# Patient Record
Sex: Male | Born: 1995
Health system: Southern US, Community
[De-identification: ages and names within clinical notes are randomized; demographics above are authoritative.]

## PROBLEM LIST (undated history)

## (undated) DIAGNOSIS — Z9189 Other specified personal risk factors, not elsewhere classified: Secondary | ICD-10-CM

## (undated) DIAGNOSIS — F909 Attention-deficit hyperactivity disorder, unspecified type: Secondary | ICD-10-CM

## (undated) DIAGNOSIS — F1010 Alcohol abuse, uncomplicated: Secondary | ICD-10-CM

## (undated) DIAGNOSIS — K219 Gastro-esophageal reflux disease without esophagitis: Secondary | ICD-10-CM

## (undated) DIAGNOSIS — R12 Heartburn: Secondary | ICD-10-CM

## (undated) DIAGNOSIS — G43909 Migraine, unspecified, not intractable, without status migrainosus: Secondary | ICD-10-CM

## (undated) DIAGNOSIS — K921 Melena: Secondary | ICD-10-CM

## (undated) DIAGNOSIS — R55 Syncope and collapse: Secondary | ICD-10-CM

## (undated) DIAGNOSIS — F1911 Other psychoactive substance abuse, in remission: Secondary | ICD-10-CM

## (undated) HISTORY — PX: TONSILLECTOMY: SUR1361

## (undated) HISTORY — DX: Melena: K92.1

## (undated) HISTORY — DX: Migraine, unspecified, not intractable, without status migrainosus: G43.909

## (undated) HISTORY — PX: FRACTURE SURGERY: SHX138

## (undated) HISTORY — DX: Alcohol abuse, uncomplicated: F10.10

## (undated) HISTORY — DX: Other specified personal risk factors, not elsewhere classified: Z91.89

## (undated) HISTORY — DX: Gastro-esophageal reflux disease without esophagitis: K21.9

## (undated) HISTORY — DX: Other psychoactive substance abuse, in remission: F19.11

## (undated) HISTORY — PX: ADENOIDECTOMY: SUR15

## (undated) HISTORY — PX: MENISCUS REPAIR: SHX5179

## (undated) HISTORY — DX: Heartburn: R12

---
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
## 2018-12-08 DIAGNOSIS — J101 Influenza due to other identified influenza virus with other respiratory manifestations: Secondary | ICD-10-CM | POA: Diagnosis not present

## 2019-04-25 ENCOUNTER — Encounter

## 2019-04-25 ENCOUNTER — Other Ambulatory Visit: Payer: Self-pay

## 2019-04-25 ENCOUNTER — Ambulatory Visit (INDEPENDENT_AMBULATORY_CARE_PROVIDER_SITE_OTHER): Payer: BLUE CROSS/BLUE SHIELD | Admitting: Family Medicine

## 2019-04-25 ENCOUNTER — Encounter: Payer: Self-pay | Admitting: Family Medicine

## 2019-04-25 VITALS — BP 125/81 | HR 89 | Temp 98.0°F | Resp 18 | Ht 68.75 in | Wt 253.0 lb

## 2019-04-25 DIAGNOSIS — G43009 Migraine without aura, not intractable, without status migrainosus: Secondary | ICD-10-CM

## 2019-04-25 DIAGNOSIS — R197 Diarrhea, unspecified: Secondary | ICD-10-CM | POA: Diagnosis not present

## 2019-04-25 DIAGNOSIS — K921 Melena: Secondary | ICD-10-CM

## 2019-04-25 DIAGNOSIS — K219 Gastro-esophageal reflux disease without esophagitis: Secondary | ICD-10-CM

## 2019-04-25 DIAGNOSIS — R112 Nausea with vomiting, unspecified: Secondary | ICD-10-CM

## 2019-04-25 MED ORDER — ESOMEPRAZOLE MAGNESIUM 40 MG PO CPDR: 40.0000 mg | DELAYED_RELEASE_CAPSULE | Freq: Every day | ORAL | 2 refills | Status: AC

## 2019-04-25 MED ORDER — SUCRALFATE 1 G PO TABS: 1.0000 g | ORAL_TABLET | Freq: Three times a day (TID) | ORAL | 0 refills | Status: AC

## 2019-04-25 MED ORDER — FAMOTIDINE 20 MG PO TABS: 20.0000 mg | ORAL_TABLET | Freq: Two times a day (BID) | ORAL | 2 refills | Status: AC

## 2019-04-25 NOTE — Progress Notes (Signed)
Patient ID: Greg Wilson, male  DOB: [birth year], 23 y.o.   MRN: [id number] Patient Care Team    Relationship Specialty Notifications Start End  Greg Wilson PCP - General Family Medicine  04/25/19     Chief Complaint  Patient presents with   Diarrhea    [facility], Dr Greg Wilson. Constantly for 2 months. Blood in stool happens every once in a while. Dark red in color. Denies fever.    Blood In Stools   Gastroesophageal Reflux    Pt gets reflux nightly and it makes him vomit. Has had this since he was 23yr old. Pt has taken nexium for this.     Subjective:  Greg Wilson a 23y.o.  male present for new patient establishment. All past medical history, surgical history, allergies, family history, immunizations, medications and social history were updated in the electronic medical record today. All recent labs, ED visits and hospitalizations within the last year were reviewed.   GI complaints:  Patient presents today for new patient establishment with multiple gastrointestinal complaints.  He reports having intermittent diarrhea at least 2-3 times a week for the last 2 months.  He reports he does have normal stool between bouts of diarrhea.  There has been occasions within those 2 months in which she saw dark red blood in his stool.  He reports he is feeling very fatigued.  He is tired all the time.  He states he just does not feel himself and he "feels sick."He thought the changes in his stool pattern was secondary to his diet, so he has changed his diet.  He stopped eating sweets, drinking soda or eating processed meat.  He does have a history of reflux and takes over-the-counter Nexium.  He states he has been getting reflux nightly that can cause him to vomit on occasions.  He has had history of reflux for 3 years.  His family history is many members with GERD.  He reports feeling pain and nausea after every meal, but at night it seems to be worse.  He has never seen a  gastroenterologist for his condition.  There are times he has felt dizzy.  He was in the emergency room in May for dehydration.  He states he felt nauseated and then dizzy, and then passed out while he was at work.  In October 2019 he was seen in the emergency room for gastroenteritis with nausea and vomiting.  In August 2018 he was seen for syncopal episode that was questionable for seizure activity.  He has been seen by neurology when he was a child. Pt denies exposure  to insects, recent travel, under prepared foods, antibiotics  or sick contacts.  He has a daily nicotine user by vaping.  He denies any CBD, cannabinoids or THC substance in his vaping.  He reports having more frequent headaches over the last few months.  He has a history of migraines and used to be on Imitrex.  He currently is using Excedrin Migraine/ASA.  He has a family history of rectal cancer in his maternal grandfather.  He does not believe anybody in the family has irritable bowel disease such as ulcerative colitis or Crohn's.  He admits to weight fluctuations over the last few months unintentionally. MGF rectal cancer. Weight fluctuations.    Depression screen PHQ 2/9 04/26/2019  Decreased Interest 0  Down, Depressed, Hopeless 0  PHQ - 2 Score 0   No flowsheet data found.  No flowsheet data found.  Immunization History  Administered Date(s) Administered   DTaP 04/03/1998, 06/06/1998, 08/07/1998, 11/28/1999, 01/31/2002   HPV Quadrivalent 02/28/2010, 07/12/2010, 10/23/2010   Hepatitis A, Ped/Adol-2 Dose 06/18/2006, 04/13/2007   Hepatitis B, ped/adol 01/08/1998, 02/28/1998, 08/07/1998   HiB (PRP-OMP) 04/03/1998, 06/06/1998, 11/05/1998, 07/22/1999   IPV 04/03/1998, 06/06/1998, 11/28/1999, 01/31/2002   Influenza Nasal 06/25/2009, 07/12/2010, 08/21/2011   Influenza, Seasonal, Injecte, Preservative Fre 08/17/2015   Influenza,Quad,Nasal, Live 06/22/2012, 07/27/2013, 07/31/2014   MMR 07/22/1999, 01/31/2002    Meningococcal Conjugate 02/28/2009   Tdap 03/10/2008   Varicella 01/30/1999, 06/18/2006    No exam data present  Past Medical History:  Diagnosis Date   ADHD (attention deficit hyperactivity disorder)    Alcohol abuse    Blood in stool    Convulsive syncope    Drug abuse in remission ([place])    GERD (gastroesophageal reflux disease)    Heartburn    History of fainting spells of unknown cause    Migraines    Allergies  Allergen Reactions   Oxycodone Hives   Watermelon Flavor Hives   Past Surgical History:  Procedure Laterality Date   ADENOIDECTOMY     FRACTURE SURGERY     MENISCUS REPAIR     TONSILLECTOMY     Family History  Problem Relation Age of Onset   GER disease Mother    Migraines Mother    GER disease Father    Diabetes Maternal Grandmother    Rectal cancer Maternal Grandfather    GER disease Paternal Grandmother    Kidney Stones Paternal Grandmother    Alcohol abuse Paternal Grandfather    Social History   Social History Narrative   Marital status/children/pets: Married   Education/employment: HS diploma, employed Clinical biochemist:      -Wears a bicycle helmet riding a bike: Yes     -smoke alarm in the home:Yes     - wears seatbelt: Yes     - Feels safe in their relationships: Yes    Allergies as of 04/25/2019      Reactions   Oxycodone Hives   Watermelon Flavor Hives      Medication List       Accurate as of April 25, 2019 11:59 PM. If you have any questions, ask your nurse or doctor.        STOP taking these medications   aspirin-acetaminophen-caffeine 250-250-65 MG tablet Commonly known as: EXCEDRIN MIGRAINE Stopped by: Howard Pouch, Wilson   cloNIDine 0.2 MG tablet Commonly known as: CATAPRES Stopped by: Howard Pouch, Wilson   esomeprazole 10 MG packet Commonly known as: [place] Replaced by: esomeprazole 40 MG capsule Stopped by: Howard Pouch, Wilson   lisdexamfetamine 70 MG capsule Commonly known as:  VYVANSE Stopped by: Howard Pouch, Wilson   Melatonin CR 3 MG Tbcr Stopped by: Howard Pouch, Wilson   MULTIVITAMIN ADULT PO Stopped by: Howard Pouch, Wilson     TAKE these medications   esomeprazole 40 MG capsule Commonly known as: NexIUM Take 1 capsule (40 mg total) by mouth daily. Replaces: esomeprazole 10 MG packet Started by: Howard Pouch, Wilson   famotidine 20 MG tablet Commonly known as: Pepcid Take 1 tablet (20 mg total) by mouth 2 (two) times daily. Started by: Howard Pouch, Wilson   rizatriptan 10 MG tablet Commonly known as: Maxalt Take 1 tablet (10 mg total) by mouth as needed for migraine. May repeat once  in 2 hours if needed Started by: Howard Pouch, Wilson   saccharomyces boulardii 250  MG capsule Commonly known as: Florastor Take 1 capsule (250 mg total) by mouth 2 (two) times daily. Started by: Howard Pouch, Wilson   sucralfate 1 g tablet Commonly known as: Carafate Take 1 tablet (1 g total) by mouth 4 (four) times daily -  with meals and at bedtime. Started by: Howard Pouch, Wilson       All past medical history, surgical history, allergies, family history, immunizations andmedications were updated in the EMR today and reviewed under the history and medication portions of their EMR.    Recent Results (from the past 2160 hour(s))  CBC     Status: Abnormal   Collection Time: 04/25/19  3:28 PM  Result Value Ref Range   WBC 6.6 3.8 - 10.8 Thousand/uL   RBC 5.69 4.20 - 5.80 Million/uL   Hemoglobin 15.3 13.2 - 17.1 g/dL   HCT 44.6 38.5 - 50.0 %   MCV 78.4 (L) 80.0 - 100.0 fL   MCH 26.9 (L) 27.0 - 33.0 pg   MCHC 34.3 32.0 - 36.0 g/dL   RDW 13.2 11.0 - 15.0 %   Platelets 287 140 - 400 Thousand/uL   MPV 11.5 7.5 - 12.5 fL  Sedimentation rate     Status: None   Collection Time: 04/25/19  3:28 PM  Result Value Ref Range   Sed Rate 9 0 - 15 mm/h  C-reactive protein     Status: Abnormal   Collection Time: 04/25/19  3:28 PM  Result Value Ref Range   CRP 9.0 (H) <8.0 mg/L  Comp Met  (CMET)     Status: Abnormal   Collection Time: 04/25/19  3:28 PM  Result Value Ref Range   Glucose, Bld 94 65 - 99 mg/dL    Comment: .            Fasting reference interval .    BUN 14 7 - 25 mg/dL   Creat 0.97 0.60 - 1.35 mg/dL   BUN/Creatinine Ratio NOT APPLICABLE 6 - 22 (calc)   Sodium 139 135 - 146 mmol/L   Potassium 4.3 3.5 - 5.3 mmol/L   Chloride 107 98 - 110 mmol/L   CO2 22 20 - 32 mmol/L   Calcium 9.6 8.6 - 10.3 mg/dL   Total Protein 7.3 6.1 - 8.1 g/dL   Albumin 4.3 3.6 - 5.1 g/dL   Globulin 3.0 1.9 - 3.7 g/dL (calc)   AG Ratio 1.4 1.0 - 2.5 (calc)   Total Bilirubin 0.5 0.2 - 1.2 mg/dL   Alkaline phosphatase (APISO) 74 36 - 130 U/L   AST 31 10 - 40 U/L   ALT 66 (H) 9 - 46 U/L  TSH     Status: None   Collection Time: 04/25/19  3:28 PM  Result Value Ref Range   TSH 1.58 0.40 - 4.50 mIU/L    No results found.   ROS: 14 pt review of systems performed and negative (unless mentioned in an HPI)  Objective: BP 125/81 (BP Location: Left Arm, Patient Position: Sitting, Cuff Size: Normal)    Pulse 89    Temp 98 F (36.7 C) (Temporal)    Resp 18    Ht 5' 8.75" (1.746 m)    Wt 253 lb (114.8 kg)    SpO2 97%    BMI 37.63 kg/m  Gen: Afebrile. No acute distress. Nontoxic in appearance, well-developed, well-nourished,  Obese caucasian male.  HENT: AT. [place].  MMM, no oral lesions, adequate dentition. Bilateral nares within normal limits. Throat without erythema,  ulcerations or exudates. no Cough on exam, no hoarseness on exam. Eyes:Pupils Equal Round Reactive to light, Extraocular movements intact,  Conjunctiva without redness, discharge or icterus. Neck/lymp/endocrine: Supple,no lymphadenopathy, no thyromegaly CV: RRR no murmur, no edema Chest: CTAB, no wheeze, rhonchi or crackles. normal Respiratory effort. good Air movement. Abd: Soft. obese. NTND. BS present.  Skin: no rashes, purpura or petechiae. Warm and well-perfused. Skin intact. Neuro/Msk:  Normal gait. PERLA. EOMi. Alert.  Oriented x3.   Psych: Normal affect, dress and demeanor. Normal speech. Normal thought content and judgment.   Assessment/plan: Ronav Furney is a 23 y.o. male present for EST care Diarrhea, unspecified type/Blood in stool/fatigue -Discussed different possibilities of etiology today.  Does not seem infectious given he has intermittent diarrhea with normal stools between.  No known family history of IBD.  He does have a family history of rectal cancer in his paternal grandfather had an older age. -Start probiotics.  Florastor prescribed for him today. -Start daily vitamin D 600 to 800 units daily and B complex. -Prior review labs in the emergency room looks like he may have some iron deficiency by his CBC will test a CBC and iron today and replace with supplement if appropriate. -Hydrate.  Discussed the role of dehydration with fatigue and with chronic diarrhea on can become easily dehydrated. -Consider stool studies. - CBC - Sedimentation rate - C-reactive protein - POC Hemoccult Bld/Stl (3-Cd Home Screen); Future - Comp Met (CMET) - H. pylori antibody, IgG - TSH - Iron panel  Nausea and vomiting, intractability of vomiting not specified, unspecified vomiting type/Gastroesophageal reflux disease, esophagitis presence not specified -History concerning for possible peptic ulcer disease versus GERD versus ulcerative colitis. -Stop vaping.  vaping is known to cause nausea and vomiting.  He denies any marijuana substances in his vaping. -Start Nexium, Pepcid and Carafate as prescribed.  This was explained to him today. -Avoid known triggers and avoid laying flat after meals.  Wait at least 3 hours after meals to lay down. - POC Hemoccult Bld/Stl (3-Cd Home Screen); Future - Comp Met (CMET) - H. pylori antibody, IgG  Migraine without aura and without status migrainosus, not intractable -Stop use of aspirin and Excedrin Migraine.  May be contributing to his bloody stools. -Maxalt  prescribed for headaches.  Avoid Imitrex due to potential low threshold for seizure. -Could consider amitriptyline in the future to help with migraines and potential IBS. -Stop vaping.  Vaping is know to increase fatigue and headaches.   -Follow-up 4 weeks on GI complaints and migraines, with migraine diary    Return in about 4 weeks (around 05/23/2019).   Note is dictated utilizing voice recognition software. Although note has been proof read prior to signing, occasional typographical errors still can be missed. If any questions arise, please Wilson not hesitate to call for verification.  Electronically signed by: Howard Pouch, Wilson [place]

## 2019-04-25 NOTE — Patient Instructions (Addendum)
Nice to meet you today.  Please complete the cards sent home with you today and return.  We will call with lab results.  Start nexium and the pepcid.  carafate before meals and before bed.   Follow up 4 weeks.   Please help [place] help you:  We are honored you have chosen [facility] for your Primary Care home. Below you will find basic instructions that you may need to access in the future. Please help [place] help you by reading the instructions, which cover many of the frequent questions we experience.   Prescription refills and request:  -In order to allow more efficient response time, please call your pharmacy for all refills. They will forward the request electronically to [place]. This allows for the quickest possible response. Request left on a nurse line can take longer to refill, since these are checked as time allows between office patients and other phone calls.  - refill request can take up to 3-5 working days to complete.  - If request is sent electronically and request is appropiate, it is usually completed in 1-2 business days.  - all patients will need to be seen routinely for all chronic medical conditions requiring prescription medications (see follow-up below). If you are overdue for follow up on your condition, you will be asked to make an appointment and we will call in enough medication to cover you until your appointment (up to 30 days).  - all controlled substances will require a face to face visit to request/refill.  - if you desire your prescriptions to go through a new pharmacy, and have an active script at original pharmacy, you will need to call your pharmacy and have scripts transferred to new pharmacy. This is completed between the pharmacy locations and not by your provider.    Results: If any images or labs were ordered, it can take up to 1 week to get results depending on the test ordered and the lab/facility running and resulting the test. - Normal or stable results,  which do not need further discussion, may be released to your mychart immediately with attached note to you. A call may not be generated for normal results. Please make certain to sign up for mychart. If you have questions on how to activate your mychart you can call the front office.  - If your results need further discussion, our office will attempt to contact you via phone, and if unable to reach you after 2 attempts, we will release your abnormal result to your mychart with instructions.  - All results will be automatically released in mychart after 1 week.  - Your provider will provide you with explanation and instruction on all relevant material in your results. Please keep in mind, results and labs may appear confusing or abnormal to the untrained eye, but it does not mean they are actually abnormal for you personally. If you have any questions about your results that are not covered, or you desire more detailed explanation than what was provided, you should make an appointment with your provider to do so.   Our office handles many outgoing and incoming calls daily. If we have not contacted you within 1 week about your results, please check your mychart to see if there is a message first and if not, then contact our office.  In helping with this matter, you help decrease call volume, and therefore allow [place] to be able to respond to patients needs more efficiently.   Acute office  visits (sick visit):  An acute visit is intended for a new problem and are scheduled in shorter time slots to allow schedule openings for patients with new problems. This is the appropriate visit to discuss a new problem. Problems will not be addressed by phone call or Echart message. Appointment is needed if requesting treatment. In order to provide you with excellent quality medical care with proper time for you to explain your problem, have an exam and receive treatment with instructions, these appointments should be limited  to one new problem per visit. If you experience a new problem, in which you desire to be addressed, please make an acute office visit, we save openings on the schedule to accommodate you. Please do not save your new problem for any other type of visit, let us take care of it properly and quickly for you.   Follow up visits:  Depending on your condition(s) your provider will need to see you routinely in order to provide you with quality care and prescribe medication(s). Most chronic conditions (Example: hypertension, Diabetes, depression/anxiety... etc), require visits a couple times a year. Your provider will instruct you on proper follow up for your personal medical conditions and history. Please make certain to make follow up appointments for your condition as instructed. Failing to do so could result in lapse in your medication treatment/refills. If you request a refill, and are overdue to be seen on a condition, we will always provide you with a 30 day script (once) to allow you time to schedule.    Medicare wellness (well visit): - we have a wonderful Nurse Maudie Mercury), that will meet with you and provide you will yearly medicare wellness visits. These visits should occur yearly (can not be scheduled less than 1 calendar year apart) and cover preventive health, immunizations, advance directives and screenings you are entitled to yearly through your medicare benefits. Do not miss out on your entitled benefits, this is when medicare will pay for these benefits to be ordered for you.  These are strongly encouraged by your provider and is the appropriate type of visit to make certain you are up to date with all preventive health benefits. If you have not had your medicare wellness exam in the last 12 months, please make certain to schedule one by calling the office and schedule your medicare wellness with Maudie Mercury as soon as possible.   Yearly physical (well visit):  - Adults are recommended to be seen yearly for  physicals. Check with your insurance and date of your last physical, most insurances require one calendar year between physicals. Physicals include all preventive health topics, screenings, medical exam and labs that are appropriate for gender/age and history. You may have fasting labs needed at this visit. This is a well visit (not a sick visit), new problems should not be covered during this visit (see acute visit).  - Pediatric patients are seen more frequently when they are younger. Your provider will advise you on well child visit timing that is appropriate for your their age. - This is not a medicare wellness visit. Medicare wellness exams do not have an exam portion to the visit. Some medicare companies allow for a physical, some do not allow a yearly physical. If your medicare allows a yearly physical you can schedule the medicare wellness with our nurse Maudie Mercury and have your physical with your provider after, on the same day. Please check with insurance for your full benefits.   Late Policy/No Shows:  - all  new patients should arrive 15-30 minutes earlier than appointment to allow [place] time  to  obtain all personal demographics,  insurance information and for you to complete office paperwork. - All established patients should arrive 10-15 minutes earlier than appointment time to update all information and be checked in .  - In our best efforts to run on time, if you are late for your appointment you will be asked to either reschedule or if able, we will work you back into the schedule. There will be a wait time to work you back in the schedule,  depending on availability.  - If you are unable to make it to your appointment as scheduled, please call 24 hours ahead of time to allow [place] to fill the time slot with someone else who needs to be seen. If you do not cancel your appointment ahead of time, you may be charged a no show fee.

## 2019-04-26 ENCOUNTER — Telehealth: Payer: Self-pay | Admitting: Family Medicine

## 2019-04-26 ENCOUNTER — Encounter: Payer: Self-pay | Admitting: Family Medicine

## 2019-04-26 DIAGNOSIS — R112 Nausea with vomiting, unspecified: Secondary | ICD-10-CM | POA: Insufficient documentation

## 2019-04-26 DIAGNOSIS — G43909 Migraine, unspecified, not intractable, without status migrainosus: Secondary | ICD-10-CM | POA: Insufficient documentation

## 2019-04-26 DIAGNOSIS — K921 Melena: Secondary | ICD-10-CM | POA: Insufficient documentation

## 2019-04-26 DIAGNOSIS — K219 Gastro-esophageal reflux disease without esophagitis: Secondary | ICD-10-CM | POA: Insufficient documentation

## 2019-04-26 DIAGNOSIS — R197 Diarrhea, unspecified: Secondary | ICD-10-CM | POA: Insufficient documentation

## 2019-04-26 LAB — H. PYLORI ANTIBODY, IGG: H Pylori IgG: NEGATIVE

## 2019-04-26 MED ORDER — SACCHAROMYCES BOULARDII 250 MG PO CAPS: 250.0000 mg | ORAL_CAPSULE | Freq: Two times a day (BID) | ORAL | 2 refills | Status: AC

## 2019-04-26 MED ORDER — RIZATRIPTAN BENZOATE 10 MG PO TABS: 10.0000 mg | ORAL_TABLET | ORAL | 2 refills | Status: AC | PRN

## 2019-04-26 NOTE — Telephone Encounter (Signed)
Please inform patient the following information: I am still waiting for a few of his labs to return.  However so far it appears he has a mildly elevated liver enzyme called ALT.  And a mildly elevated inflammatory marker called CRP.  His is not anemic, but his CBC does show evidence of possible iron deficiency.  We are waiting on his iron panel.  His thyroid is normal. -We will call him with the iron panel once we get those results and the other results. -However I wanted to keep him updated and let him know I have called in additional medications and instructions.   -start the Nexium, Pepcid and Carafate as we discussed yesterday.  Hydrate.   -I also called in probiotics to take twice a day.  This helps replace the normal flora within the gut lining and can help with chronic diarrhea.    -I would like him to avoid the use of all aspirins, NSAIDs and Excedrin.  This can be causing the bloody stools and the low iron.  I have called in a medicine called Maxalt for his migraines.  It is similar to Imitrex, which he has been on in the past you take the medicine when you feel a migraine presenting and you can repeat the dose once in 2 hours if needed only.   -I would like him to try to keep a "migraine diary ".  There are phone apps for this as well if he does not want to write it down.  It is simpl writing down when you get migraines, how long they last, any symptoms and presenting factors (less sleep, certain foods, stress etc.)   -Start a vitamin D 600-800 units daily and a B complex vitamin.  Both of these can help with fatigue and B complex helps with headaches.   -Lastly I would highly recommend he stop vaping.  Vaping is known to cause headaches, fatigue and nausea and even increase reflux.

## 2019-04-26 NOTE — Telephone Encounter (Signed)
Called pt and LM to return call.

## 2019-04-26 NOTE — Telephone Encounter (Signed)
Pt was called and given results, he verbalized understanding  
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
## 2019-04-27 LAB — COMPREHENSIVE METABOLIC PANEL
AG Ratio: 1.4 (calc) (ref 1.0–2.5)
ALT: 66 U/L — ABNORMAL HIGH (ref 9–46)
AST: 31 U/L (ref 10–40)
Albumin: 4.3 g/dL (ref 3.6–5.1)
Alkaline phosphatase (APISO): 74 U/L (ref 36–130)
BUN: 14 mg/dL (ref 7–25)
CO2: 22 mmol/L (ref 20–32)
Calcium: 9.6 mg/dL (ref 8.6–10.3)
Chloride: 107 mmol/L (ref 98–110)
Creat: 0.97 mg/dL (ref 0.60–1.35)
Globulin: 3 g/dL (calc) (ref 1.9–3.7)
Glucose, Bld: 94 mg/dL (ref 65–99)
Potassium: 4.3 mmol/L (ref 3.5–5.3)
Sodium: 139 mmol/L (ref 135–146)
Total Bilirubin: 0.5 mg/dL (ref 0.2–1.2)
Total Protein: 7.3 g/dL (ref 6.1–8.1)

## 2019-04-27 LAB — CBC
HCT: 44.6 % (ref 38.5–50.0)
Hemoglobin: 15.3 g/dL (ref 13.2–17.1)
MCH: 26.9 pg — ABNORMAL LOW (ref 27.0–33.0)
MCHC: 34.3 g/dL (ref 32.0–36.0)
MCV: 78.4 fL — ABNORMAL LOW (ref 80.0–100.0)
MPV: 11.5 fL (ref 7.5–12.5)
Platelets: 287 10*3/uL (ref 140–400)
RBC: 5.69 10*6/uL (ref 4.20–5.80)
RDW: 13.2 % (ref 11.0–15.0)
WBC: 6.6 10*3/uL (ref 3.8–10.8)

## 2019-04-27 LAB — IRON,TIBC AND FERRITIN PANEL
%SAT: 29 % (calc) (ref 20–48)
Ferritin: 159 ng/mL (ref 38–380)
Iron: 93 ug/dL (ref 50–195)
TIBC: 321 mcg/dL (calc) (ref 250–425)

## 2019-04-27 LAB — TEST AUTHORIZATION

## 2019-04-27 LAB — TSH: TSH: 1.58 mIU/L (ref 0.40–4.50)

## 2019-04-27 LAB — C-REACTIVE PROTEIN: CRP: 9 mg/L — ABNORMAL HIGH (ref <8.0)

## 2019-04-27 LAB — SEDIMENTATION RATE: Sed Rate: 9 mm/h (ref 0–15)

## 2019-04-28 ENCOUNTER — Telehealth: Payer: Self-pay | Admitting: Family Medicine

## 2019-04-28 NOTE — Telephone Encounter (Signed)
Please inform patient the following information: His iron panel is normal.  Please remind him to return the fecal occult blood cards. He needs his follow-up scheduled in 3 weeks.  Please schedule this now.  We will go over all results and discuss further plan in 3 weeks depending upon how he feels with the medications.

## 2019-04-28 NOTE — Telephone Encounter (Signed)
Pt was called and given lab results and he dropped off occult cards this AM. F/U appt was made. Pt verbalized understanding on plan of care

## 2019-05-01 ENCOUNTER — Other Ambulatory Visit: Payer: Self-pay

## 2019-05-01 ENCOUNTER — Emergency Department
Admission: EM | Admit: 2019-05-01 | Discharge: 2019-05-01 | Disposition: A | Discharge status: Home / Self Care | Payer: BC Managed Care – PPO | Source: Home / Self Care

## 2019-05-01 ENCOUNTER — Encounter: Payer: Self-pay | Admitting: Emergency Medicine

## 2019-05-01 DIAGNOSIS — S161XXA Strain of muscle, fascia and tendon at neck level, initial encounter: Secondary | ICD-10-CM

## 2019-05-01 DIAGNOSIS — G8911 Acute pain due to trauma: Secondary | ICD-10-CM | POA: Diagnosis not present

## 2019-05-01 DIAGNOSIS — M542 Cervicalgia: Secondary | ICD-10-CM | POA: Diagnosis not present
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
## 2019-05-01 NOTE — Discharge Instructions (Signed)
°  As recommended by the other urgent care facility based on their imaging, it is recommended you go to the emergency department today for a CT scan of your neck.  Please follow up with your family doctor as well as request your medical records from the other facility for your records.
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
## 2019-05-18 ENCOUNTER — Other Ambulatory Visit: Payer: Self-pay

## 2019-05-18 ENCOUNTER — Ambulatory Visit: Payer: BC Managed Care – PPO | Admitting: Family Medicine

## 2019-05-18 ENCOUNTER — Encounter: Payer: Self-pay | Admitting: Family Medicine

## 2019-05-18 VITALS — BP 108/78 | HR 64 | Temp 97.4°F | Resp 18 | Ht 69.0 in | Wt 257.5 lb

## 2019-05-18 DIAGNOSIS — R197 Diarrhea, unspecified: Secondary | ICD-10-CM

## 2019-05-18 DIAGNOSIS — K921 Melena: Secondary | ICD-10-CM | POA: Diagnosis not present

## 2019-05-18 DIAGNOSIS — G43009 Migraine without aura, not intractable, without status migrainosus: Secondary | ICD-10-CM

## 2019-05-18 DIAGNOSIS — R945 Abnormal results of liver function studies: Secondary | ICD-10-CM

## 2019-05-18 DIAGNOSIS — K219 Gastro-esophageal reflux disease without esophagitis: Secondary | ICD-10-CM

## 2019-05-18 DIAGNOSIS — R1011 Right upper quadrant pain: Secondary | ICD-10-CM | POA: Diagnosis not present

## 2019-05-18 DIAGNOSIS — R7989 Other specified abnormal findings of blood chemistry: Secondary | ICD-10-CM

## 2019-05-18 DIAGNOSIS — R112 Nausea with vomiting, unspecified: Secondary | ICD-10-CM | POA: Diagnosis not present

## 2019-05-18 LAB — COMPREHENSIVE METABOLIC PANEL
ALT: 62 U/L — ABNORMAL HIGH (ref 0–53)
AST: 33 U/L (ref 0–37)
Albumin: 4.4 g/dL (ref 3.5–5.2)
Alkaline Phosphatase: 72 U/L (ref 39–117)
BUN: 15 mg/dL (ref 6–23)
CO2: 25 mEq/L (ref 19–32)
Calcium: 9.7 mg/dL (ref 8.4–10.5)
Chloride: 104 mEq/L (ref 96–112)
Creatinine, Ser: 0.94 mg/dL (ref 0.40–1.50)
GFR: 101.02 mL/min (ref >60.00)
Glucose, Bld: 99 mg/dL (ref 70–99)
Potassium: 4.7 mEq/L (ref 3.5–5.1)
Sodium: 138 mEq/L (ref 135–145)
Total Bilirubin: 0.4 mg/dL (ref 0.2–1.2)
Total Protein: 7.1 g/dL (ref 6.0–8.3)

## 2019-05-18 LAB — C-REACTIVE PROTEIN: CRP: 1.2 mg/dL (ref 0.5–20.0)

## 2019-05-18 LAB — LIPASE: Lipase: 20 U/L (ref 11.0–59.0)

## 2019-05-18 LAB — SEDIMENTATION RATE: Sed Rate: 28 mm/hr — ABNORMAL HIGH (ref 0–15)
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
## 2019-05-18 NOTE — Patient Instructions (Addendum)
Continue the nexium, pepcid and probiotic for 3 months.  We will call you with lab results and decide on need for an image at that time.   Continue to cut back on the vaping. I know it is really hard- but it can help your health tremendously.

## 2019-05-19 ENCOUNTER — Telehealth: Payer: Self-pay | Admitting: Family Medicine

## 2019-05-19 DIAGNOSIS — R1011 Right upper quadrant pain: Secondary | ICD-10-CM

## 2019-05-19 DIAGNOSIS — R7989 Other specified abnormal findings of blood chemistry: Secondary | ICD-10-CM

## 2019-05-19 DIAGNOSIS — R112 Nausea with vomiting, unspecified: Secondary | ICD-10-CM

## 2019-05-19 LAB — HEPATITIS PANEL, ACUTE
Hep A IgM: NONREACTIVE
Hep B C IgM: NONREACTIVE
Hepatitis B Surface Ag: NONREACTIVE
Hepatitis C Ab: NONREACTIVE
SIGNAL TO CUT-OFF: 0.01 (ref <1.00)

## 2019-05-19 NOTE — Telephone Encounter (Signed)
Please inform patient the following information: His liver enzyme is still elevated about the same.  His inflammatory marker is also elevated.  I would recommend we proceed with a ultrasound of his abdomen, with focus on his right upper quadrant.  I have ordered this for him today. He will need to schedule a follow-up in 2 weeks with this provider so that we can review all of the ultrasound results and discuss plan.

## 2019-05-19 NOTE — Telephone Encounter (Signed)
Called patient and he verbalized understanding. He is looking for a call to have his ultrasound scheduled and a follow up appointment made

## 2019-05-19 NOTE — Telephone Encounter (Signed)
Patient has been scheduled 05/26/19

## 2019-05-26 ENCOUNTER — Telehealth: Payer: Self-pay | Admitting: Family Medicine

## 2019-05-26 ENCOUNTER — Ambulatory Visit
Admission: RE | Admit: 2019-05-26 | Discharge: 2019-05-26 | Disposition: A | Discharge status: Home / Self Care | Payer: BC Managed Care – PPO | Source: Ambulatory Visit | Attending: Family Medicine | Admitting: Family Medicine

## 2019-05-26 DIAGNOSIS — R1011 Right upper quadrant pain: Secondary | ICD-10-CM

## 2019-05-26 DIAGNOSIS — R112 Nausea with vomiting, unspecified: Secondary | ICD-10-CM

## 2019-05-26 DIAGNOSIS — K219 Gastro-esophageal reflux disease without esophagitis: Secondary | ICD-10-CM

## 2019-05-26 DIAGNOSIS — R7989 Other specified abnormal findings of blood chemistry: Secondary | ICD-10-CM

## 2019-05-26 NOTE — Telephone Encounter (Signed)
Please inform patient the following information: He is abdominal ultrasound resulted with changes to the liver texture which can be seen with cirrhosis or certain liver disorders.  His gallbladder did not show evidence of any stones or inflammation. Since he still has a mildly elevated liver enzyme- ALT and a elevated sed rate which is inflammatory marker will refer to gastroenterology for further evaluation.  Continue the medications prescribed.

## 2019-05-26 NOTE — Telephone Encounter (Signed)
Pt was called and given information. He verbalized understanding  

## 2019-06-10 DIAGNOSIS — K219 Gastro-esophageal reflux disease without esophagitis: Secondary | ICD-10-CM | POA: Diagnosis not present

## 2019-06-10 DIAGNOSIS — R112 Nausea with vomiting, unspecified: Secondary | ICD-10-CM | POA: Diagnosis not present

## 2019-06-10 DIAGNOSIS — R197 Diarrhea, unspecified: Secondary | ICD-10-CM | POA: Diagnosis not present

## 2019-06-10 DIAGNOSIS — R194 Change in bowel habit: Secondary | ICD-10-CM | POA: Diagnosis not present

## 2019-06-27 DIAGNOSIS — K635 Polyp of colon: Secondary | ICD-10-CM | POA: Diagnosis not present

## 2019-06-27 DIAGNOSIS — R194 Change in bowel habit: Secondary | ICD-10-CM | POA: Diagnosis not present

## 2019-06-27 DIAGNOSIS — R112 Nausea with vomiting, unspecified: Secondary | ICD-10-CM | POA: Diagnosis not present

## 2019-06-27 DIAGNOSIS — K3189 Other diseases of stomach and duodenum: Secondary | ICD-10-CM | POA: Diagnosis not present

## 2019-06-27 DIAGNOSIS — K648 Other hemorrhoids: Secondary | ICD-10-CM | POA: Diagnosis not present

## 2019-06-27 DIAGNOSIS — K2950 Unspecified chronic gastritis without bleeding: Secondary | ICD-10-CM | POA: Diagnosis not present

## 2019-06-27 DIAGNOSIS — D125 Benign neoplasm of sigmoid colon: Secondary | ICD-10-CM | POA: Diagnosis not present

## 2019-06-27 LAB — HM COLONOSCOPY

## 2019-06-30 ENCOUNTER — Encounter: Payer: Self-pay | Admitting: Family Medicine

## 2019-07-04 ENCOUNTER — Telehealth: Payer: Self-pay | Admitting: Family Medicine

## 2019-07-04 NOTE — Telephone Encounter (Signed)
Called patient and he said that his antinausea medication isnt working. Its actually making him sick. He is wondering what he could do to help please advise, thank you

## 2019-07-04 NOTE — Telephone Encounter (Signed)
Bentyl not working at all. Side effects are vomiting at least 3 times week, tried to eat something with meds, and without food. Patient would like to try something else.  Please advise. Patient is aware Dr. Raoul Pitch out of office today. He is okay with return phone call on Tuesday 07/05/19. Patient can be reached at [phone].

## 2019-07-05 ENCOUNTER — Other Ambulatory Visit: Payer: Self-pay | Admitting: Family Medicine

## 2019-07-05 DIAGNOSIS — K2970 Gastritis, unspecified, without bleeding: Secondary | ICD-10-CM | POA: Insufficient documentation

## 2019-07-05 DIAGNOSIS — K31A0 Gastric intestinal metaplasia, unspecified: Secondary | ICD-10-CM

## 2019-07-05 DIAGNOSIS — K635 Polyp of colon: Secondary | ICD-10-CM | POA: Insufficient documentation

## 2019-07-05 DIAGNOSIS — K648 Other hemorrhoids: Secondary | ICD-10-CM | POA: Insufficient documentation

## 2019-07-05 DIAGNOSIS — K3189 Other diseases of stomach and duodenum: Secondary | ICD-10-CM

## 2019-07-05 DIAGNOSIS — K2950 Unspecified chronic gastritis without bleeding: Secondary | ICD-10-CM

## 2019-07-05 NOTE — Telephone Encounter (Signed)
Pt was called and given information, he verbalized understanding  

## 2019-07-05 NOTE — Telephone Encounter (Signed)
He should call his GI doctor to discuss.

## 2019-07-25 DIAGNOSIS — R1110 Vomiting, unspecified: Secondary | ICD-10-CM | POA: Diagnosis not present

## 2019-07-25 DIAGNOSIS — R42 Dizziness and giddiness: Secondary | ICD-10-CM | POA: Diagnosis not present

## 2019-07-27 ENCOUNTER — Ambulatory Visit: Payer: BC Managed Care – PPO | Admitting: Family Medicine

## 2019-08-27 DIAGNOSIS — J019 Acute sinusitis, unspecified: Secondary | ICD-10-CM | POA: Diagnosis not present

## 2019-08-27 DIAGNOSIS — Z1159 Encounter for screening for other viral diseases: Secondary | ICD-10-CM | POA: Diagnosis not present

## 2019-08-27 DIAGNOSIS — H6123 Impacted cerumen, bilateral: Secondary | ICD-10-CM | POA: Diagnosis not present

## 2019-09-08 DIAGNOSIS — R0682 Tachypnea, not elsewhere classified: Secondary | ICD-10-CM | POA: Diagnosis not present

## 2019-09-08 DIAGNOSIS — Z91018 Allergy to other foods: Secondary | ICD-10-CM | POA: Diagnosis not present

## 2019-09-08 DIAGNOSIS — Z79899 Other long term (current) drug therapy: Secondary | ICD-10-CM | POA: Diagnosis not present

## 2019-09-08 DIAGNOSIS — R05 Cough: Secondary | ICD-10-CM | POA: Diagnosis not present

## 2019-09-08 DIAGNOSIS — Z87891 Personal history of nicotine dependence: Secondary | ICD-10-CM | POA: Diagnosis not present

## 2019-09-08 DIAGNOSIS — R0981 Nasal congestion: Secondary | ICD-10-CM | POA: Diagnosis not present

## 2019-09-08 DIAGNOSIS — J1289 Other viral pneumonia: Secondary | ICD-10-CM | POA: Diagnosis not present

## 2019-09-08 DIAGNOSIS — R0602 Shortness of breath: Secondary | ICD-10-CM | POA: Diagnosis not present

## 2019-09-08 DIAGNOSIS — K219 Gastro-esophageal reflux disease without esophagitis: Secondary | ICD-10-CM | POA: Diagnosis not present

## 2019-09-08 DIAGNOSIS — U071 COVID-19: Secondary | ICD-10-CM | POA: Diagnosis not present

## 2019-09-08 DIAGNOSIS — R918 Other nonspecific abnormal finding of lung field: Secondary | ICD-10-CM | POA: Diagnosis not present

## 2019-09-08 DIAGNOSIS — Z885 Allergy status to narcotic agent status: Secondary | ICD-10-CM | POA: Diagnosis not present

## 2019-09-12 ENCOUNTER — Telehealth: Payer: Self-pay | Admitting: Family Medicine

## 2019-09-12 NOTE — Telephone Encounter (Signed)
Patient called in to report he is COVID positive.  He was advised to call his PCP to get inhaler.  Please advise. Patient can be reached at [phone].

## 2019-09-12 NOTE — Telephone Encounter (Signed)
Pt was given inhaler to take at hospital to take 1-2 puffs q6hrs. He states he has plenty left but wanted to be proactive before the holiday weekend. He is not as short of breath as he was and states he feels better than he did. He was scheduled for a VV tomorrow. Pt was advised if he became SOB and inhaler did not improve symptoms he would need to go to ED, he verbalized understanding

## 2019-09-13 ENCOUNTER — Other Ambulatory Visit: Payer: Self-pay

## 2019-09-13 ENCOUNTER — Encounter: Payer: Self-pay | Admitting: Family Medicine

## 2019-09-13 ENCOUNTER — Ambulatory Visit (INDEPENDENT_AMBULATORY_CARE_PROVIDER_SITE_OTHER): Payer: BC Managed Care – PPO | Admitting: Family Medicine

## 2019-09-13 VITALS — Temp 98.4°F | Ht 69.0 in

## 2019-09-13 DIAGNOSIS — J1289 Other viral pneumonia: Secondary | ICD-10-CM | POA: Diagnosis not present

## 2019-09-13 DIAGNOSIS — J1282 Pneumonia due to coronavirus disease 2019: Secondary | ICD-10-CM

## 2019-09-13 DIAGNOSIS — U071 COVID-19: Secondary | ICD-10-CM

## 2019-09-13 MED ORDER — ALBUTEROL SULFATE HFA 108 (90 BASE) MCG/ACT IN AERS: 1.0000 | INHALATION_SPRAY | Freq: Four times a day (QID) | RESPIRATORY_TRACT | 1 refills | Status: AC | PRN

## 2019-09-13 NOTE — Progress Notes (Signed)
VIRTUAL VISIT VIA VIDEO  I connected with Justine Null on 09/13/19 at  4:00 PM EST by a video enabled telemedicine application and verified that I am speaking with the correct person using two identifiers. Location patient: Home Location provider: [facility], Office Persons participating in the virtual visit: Patient, Dr. Claiborne Billings and R.Baker, LPN  I discussed the limitations of evaluation and management by telemedicine and the availability of in person appointments. The patient expressed understanding and agreed to proceed.   SUBJECTIVE Chief Complaint  Patient presents with  . COVID Positive    Pt went to hospital on Thursday and got dx. He is asking for another refill on albuterol inhaler   . Pneumonia    HPI: Greg Wilson is a 23 y.o. male recently diagnosed with covid 19.  Patient reports he was seen outside the system on December 17 secondary to acute illness and shortness of breath.  He was diagnosed with COVID-19 pneumonia.  Reviewed his chest x-ray, CT and lab results available in care everywhere.  He reports he was provided with an albuterol inhaler.  However he was using it more frequently initially up to every 1-2 hours.  Since the weekend he has tapered off use and today he is only needed once.  He does have concerns he is going to need a refill over the holiday.  He has taken DayQuil that is also helping with his symptoms.  He was not prescribed any antibiotics.  He does endorse starting to feel better.  He denies any fever or chills for the past 3 days.  He states he works in [employer] and he is unsure when his exposure was present.  Although he does admit he had symptoms on Saturday, December 12 and was seen at a different urgent care and tested negative.  ROS: See pertinent positives and negatives per HPI.  Patient Active Problem List   Diagnosis Date Noted  . Hyperplastic polyp of sigmoid colon 07/05/2019  . Internal hemorrhoid 07/05/2019  . Gastritis  07/05/2019  . Intestinal metaplasia of gastric mucosa 07/05/2019  . RUQ pain 05/18/2019  . Elevated LFTs 05/18/2019  . Migraine headache 04/26/2019  . Blood in stool 04/26/2019  . Diarrhea 04/26/2019  . Nausea and vomiting 04/26/2019  . Gastroesophageal reflux disease 04/26/2019    Social History   Tobacco Use  . Smoking status: Current Every Day Smoker    Types: E-cigarettes  . Smokeless tobacco: Never Used  Substance Use Topics  . Alcohol use: No    Current Outpatient Medications:  .  albuterol (VENTOLIN HFA) 108 (90 Base) MCG/ACT inhaler, Inhale 1 puff into the lungs every 6 (six) hours as needed for wheezing or shortness of breath., Disp: , Rfl:  .  esomeprazole (NEXIUM) 40 MG capsule, Take 1 capsule (40 mg total) by mouth daily., Disp: 30 capsule, Rfl: 2 .  famotidine (PEPCID) 20 MG tablet, Take 1 tablet (20 mg total) by mouth 2 (two) times daily., Disp: 60 tablet, Rfl: 2 .  rizatriptan (MAXALT) 10 MG tablet, Take 1 tablet (10 mg total) by mouth as needed for migraine. May repeat once  in 2 hours if needed, Disp: 10 tablet, Rfl: 2 .  saccharomyces boulardii (FLORASTOR) 250 MG capsule, Take 1 capsule (250 mg total) by mouth 2 (two) times daily., Disp: 60 capsule, Rfl: 2 .  sucralfate (CARAFATE) 1 g tablet, Take 1 tablet (1 g total) by mouth 4 (four) times daily -  with meals and at bedtime., Disp:  120 tablet, Rfl: 0  Allergies  Allergen Reactions  . Oxycodone Hives  . Watermelon Flavor Hives    OBJECTIVE: Temp 98.4 F (36.9 C) (Oral)   Ht 5\' 9"  (1.753 m)   BMI 38.03 kg/m  Gen: No acute distress. Nontoxic in appearance.  HENT: AT. [place].  MMM.  Eyes:Pupils Equal Round Reactive to light, Extraocular movements intact,  Conjunctiva without redness, discharge or icterus. Chest: Cough is in on exam, no shortness of breath Skin: no rashes, purpura or petechiae.  Neuro:  Normal gait. Alert. Oriented x3  Psych: Normal affect, dress and demeanor. Normal speech. Normal thought  content and judgment.  ASSESSMENT AND PLAN: Lui Bellis is a 23 y.o. male present for  Pneumonia due to COVID-19 virus Rest.  Hydrate. Reviewed chest x-ray, CT angiogram and lab work from outside system. Albuterol refilled for him today he may use every 4-6 hours as needed.  He is aware that if he is needing his albuterol more frequently he needs to be seen in the emergency room immediately. He does feel he is improving.  Continue isolation per his urgent care visit protocol. Follow-up as needed  > 15 minutes spent with patient, > 50% of that time face to face  Howard Pouch, DO 09/13/2019

## 2019-10-12 DIAGNOSIS — R110 Nausea: Secondary | ICD-10-CM | POA: Diagnosis not present

## 2019-10-12 DIAGNOSIS — K580 Irritable bowel syndrome with diarrhea: Secondary | ICD-10-CM | POA: Diagnosis not present

## 2019-10-12 DIAGNOSIS — F439 Reaction to severe stress, unspecified: Secondary | ICD-10-CM | POA: Diagnosis not present

## 2020-05-08 DIAGNOSIS — Z20822 Contact with and (suspected) exposure to covid-19: Secondary | ICD-10-CM | POA: Diagnosis not present

## 2020-06-19 DIAGNOSIS — F17290 Nicotine dependence, other tobacco product, uncomplicated: Secondary | ICD-10-CM | POA: Diagnosis not present

## 2020-06-19 DIAGNOSIS — F909 Attention-deficit hyperactivity disorder, unspecified type: Secondary | ICD-10-CM | POA: Diagnosis not present

## 2020-06-19 DIAGNOSIS — R1031 Right lower quadrant pain: Secondary | ICD-10-CM | POA: Diagnosis not present

## 2020-06-19 DIAGNOSIS — R109 Unspecified abdominal pain: Secondary | ICD-10-CM | POA: Diagnosis not present

## 2020-06-19 DIAGNOSIS — Z7982 Long term (current) use of aspirin: Secondary | ICD-10-CM | POA: Diagnosis not present

## 2020-06-19 DIAGNOSIS — M545 Low back pain: Secondary | ICD-10-CM | POA: Diagnosis not present

## 2020-06-19 DIAGNOSIS — Z888 Allergy status to other drugs, medicaments and biological substances status: Secondary | ICD-10-CM | POA: Diagnosis not present

## 2020-06-19 DIAGNOSIS — K219 Gastro-esophageal reflux disease without esophagitis: Secondary | ICD-10-CM | POA: Diagnosis not present

## 2020-06-19 DIAGNOSIS — K6389 Other specified diseases of intestine: Secondary | ICD-10-CM | POA: Diagnosis not present

## 2020-06-19 DIAGNOSIS — K760 Fatty (change of) liver, not elsewhere classified: Secondary | ICD-10-CM | POA: Diagnosis not present

## 2020-06-19 DIAGNOSIS — R110 Nausea: Secondary | ICD-10-CM | POA: Diagnosis not present

## 2020-06-19 DIAGNOSIS — Z91018 Allergy to other foods: Secondary | ICD-10-CM | POA: Diagnosis not present

## 2020-06-19 DIAGNOSIS — K659 Peritonitis, unspecified: Secondary | ICD-10-CM | POA: Diagnosis not present

## 2020-06-19 DIAGNOSIS — Z79899 Other long term (current) drug therapy: Secondary | ICD-10-CM | POA: Diagnosis not present

## 2020-08-12 DIAGNOSIS — J00 Acute nasopharyngitis [common cold]: Secondary | ICD-10-CM | POA: Diagnosis not present

## 2020-08-12 DIAGNOSIS — J029 Acute pharyngitis, unspecified: Secondary | ICD-10-CM | POA: Diagnosis not present

## 2020-09-02 IMAGING — US US ABDOMEN COMPLETE
1 series · 13 of 25 positions shown · non-contrast
Comparison: None

CLINICAL DATA: RIGHT upper quadrant pain, nausea, and vomiting for
2 months, elevated LFTs

EXAM:
ABDOMEN ULTRASOUND COMPLETE

[Series 1: us abdomen complete · 0.25mm/px · 13 of 76 slices shown]
[im 1/76]
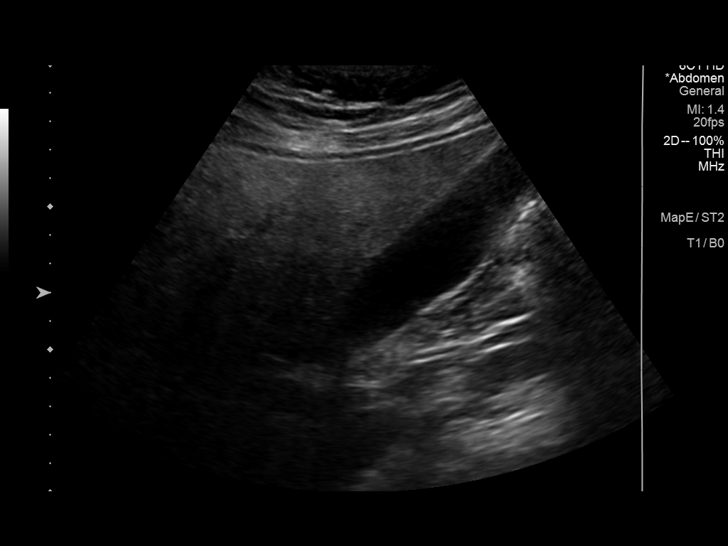
[im 7/76]
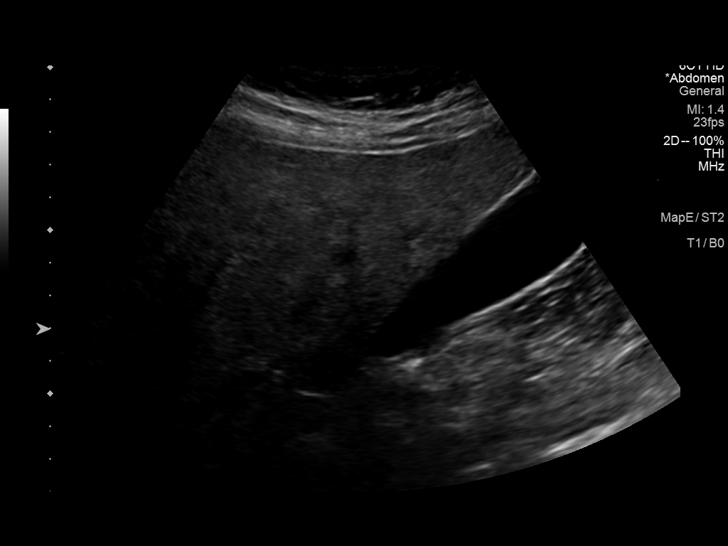
[im 13/76]
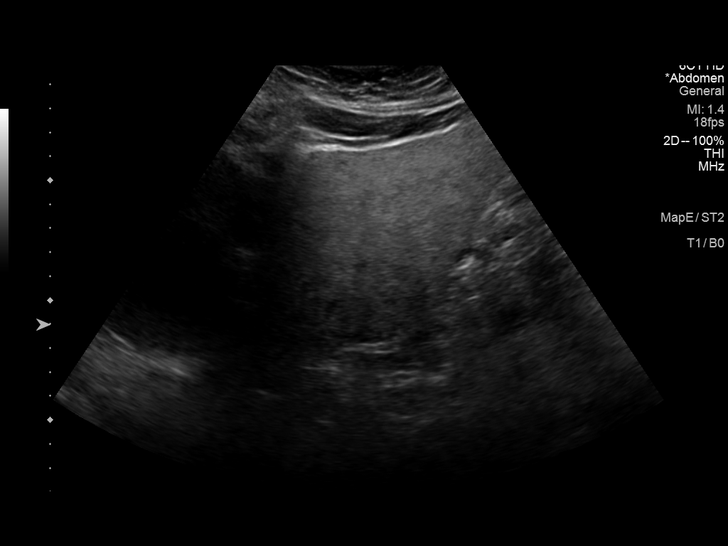
[im 19/76]
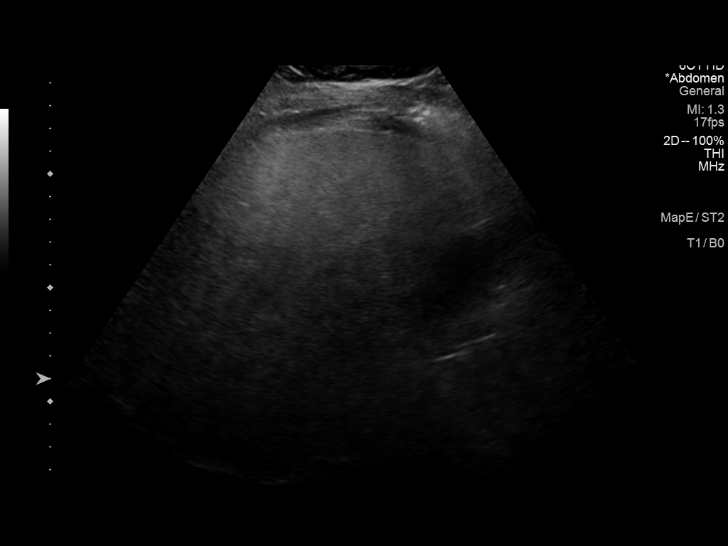
[im 26/76]
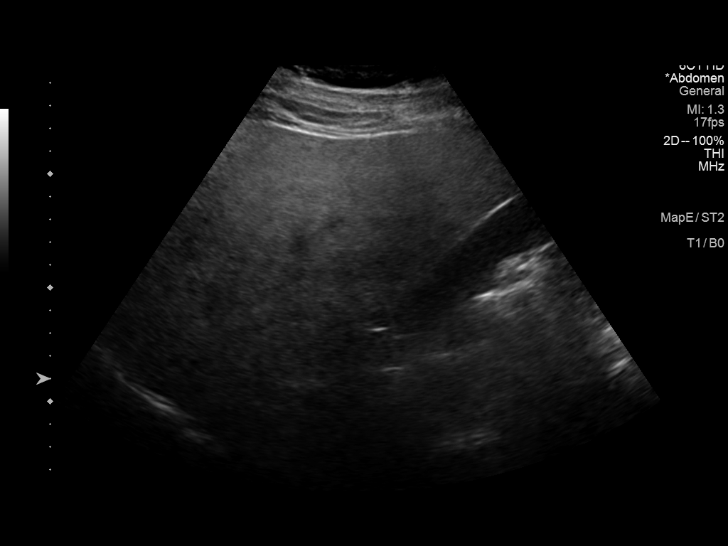
[im 32/76]
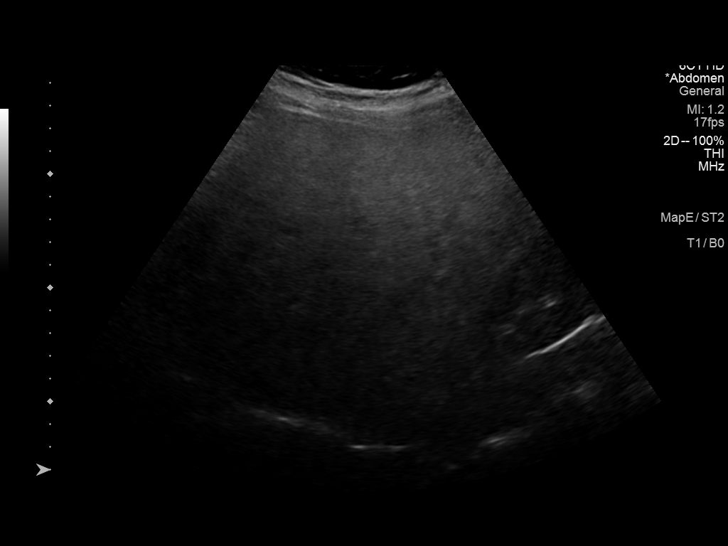
[im 38/76]
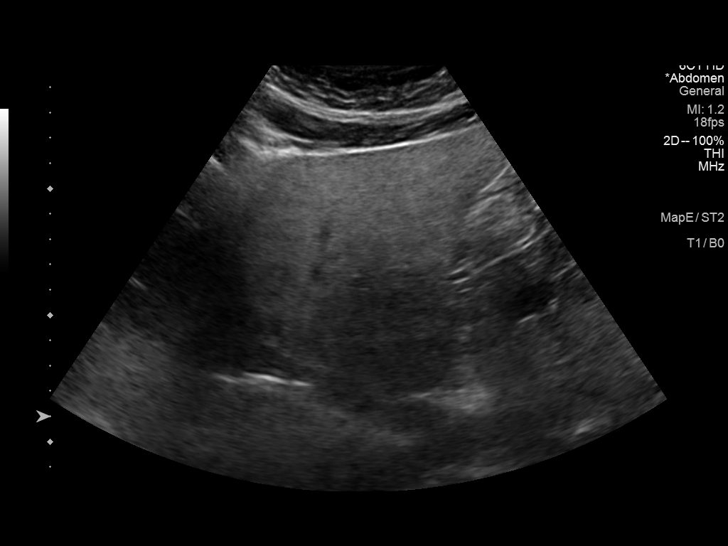
[im 44/76]
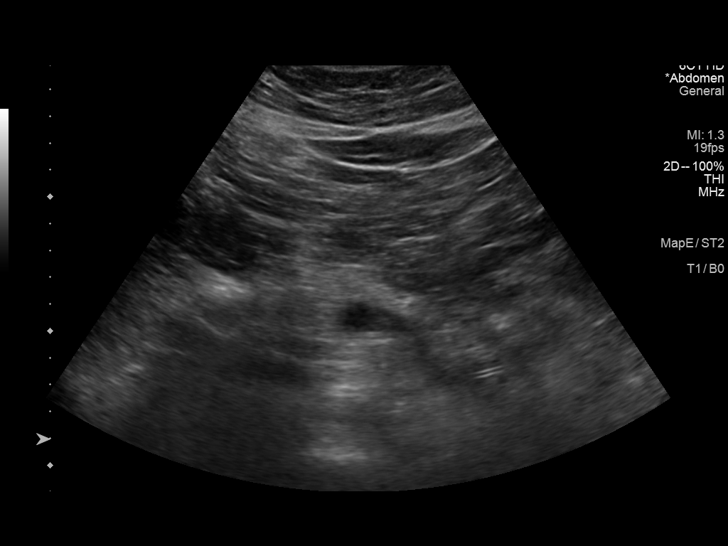
[im 51/76]
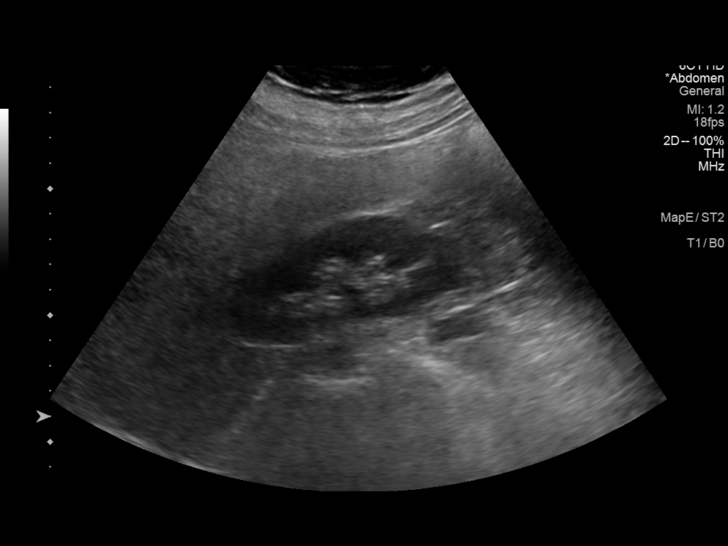
[im 57/76]
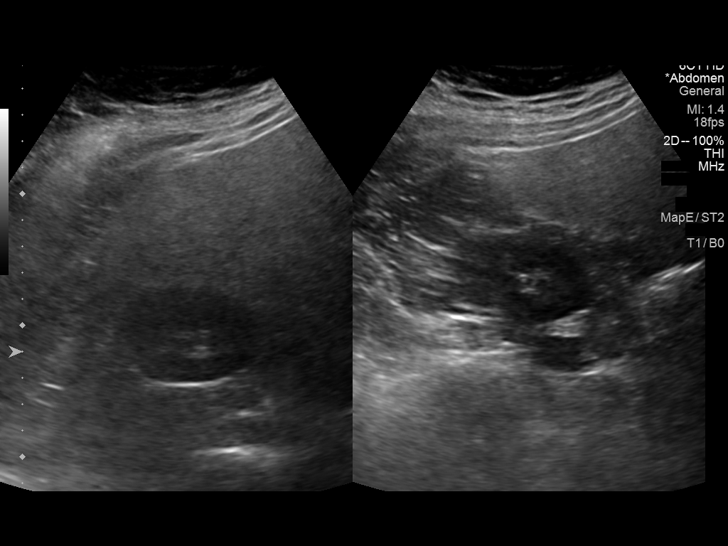
[im 63/76]
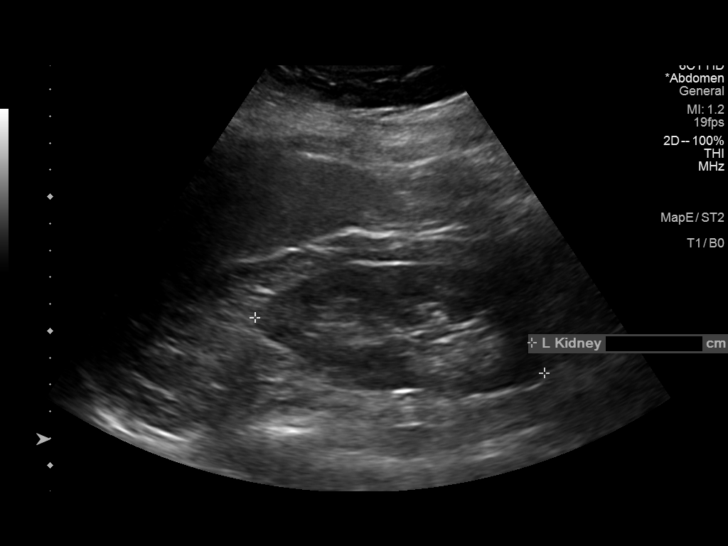
[im 69/76]
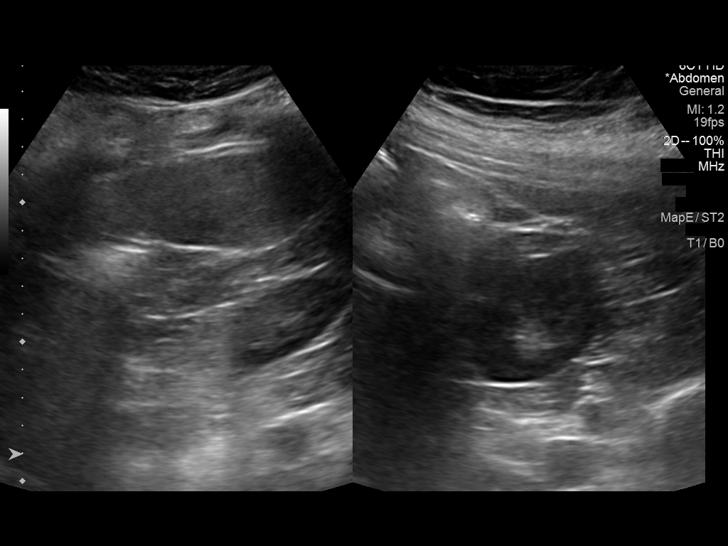
[im 76/76]
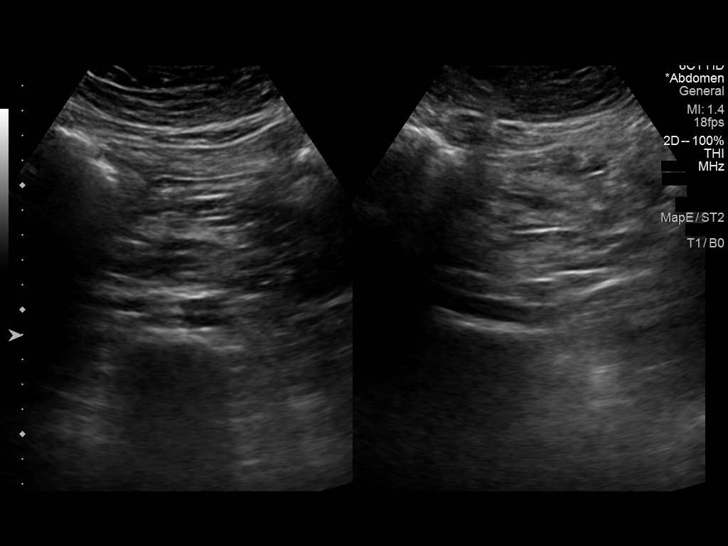

[13 of 25 positions shown; findings below may reference images not displayed]

FINDINGS: Gallbladder: Normally distended without stones or wall thickening.
No pericholecystic fluid or sonographic Murphy sign.

Common bile duct: Diameter: 4 mm, normal

Liver: Echogenic parenchyma, likely fatty infiltration though this
can be seen with cirrhosis and certain infiltrative disorders. No
hepatic nodularity or definite mass lesion, though assessment of
intrahepatic detail is suboptimal due to sound attenuation. Portal
vein is patent on color Doppler imaging with normal direction of
blood flow towards the liver.

IVC: Normal appearance

Pancreas: Normal appearance

Spleen: Normal appearance, 11.1 cm length

Right Kidney: Length: 10.0 cm. Normal morphology without mass or
hydronephrosis.

Left Kidney: Length: 11.0 cm. Normal morphology without mass or
hydronephrosis.

Abdominal aorta: Normal caliber

Other findings: No free fluid
IMPRESSION: Probable fatty infiltration of liver as above.

Suboptimal visualization of intrahepatic detail due to sound
attenuation, no gross abnormality identified.

Remainder of exam normal.

## 2020-11-14 DIAGNOSIS — J0100 Acute maxillary sinusitis, unspecified: Secondary | ICD-10-CM | POA: Diagnosis not present
